# Patient Record
Sex: Female | Born: 2000 | Race: Black or African American | Hispanic: No | Marital: Single | Smoking: Never smoker
Health system: Southern US, Community
[De-identification: ages and names within clinical notes are randomized; demographics above are authoritative.]

## PROBLEM LIST (undated history)

## (undated) DIAGNOSIS — L709 Acne, unspecified: Secondary | ICD-10-CM

## (undated) DIAGNOSIS — F938 Other childhood emotional disorders: Secondary | ICD-10-CM

---
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
## 2019-07-24 ENCOUNTER — Ambulatory Visit: Payer: No Typology Code available for payment source | Admitting: Women's Health

## 2022-09-29 ENCOUNTER — Emergency Department
Admission: EM | Admit: 2022-09-29 | Discharge: 2022-09-29 | Disposition: A | Discharge status: Home / Self Care | Payer: No Typology Code available for payment source | Attending: Emergency Medicine | Admitting: Emergency Medicine

## 2022-09-29 ENCOUNTER — Other Ambulatory Visit: Payer: Self-pay

## 2022-09-29 DIAGNOSIS — Y900 Blood alcohol level of less than 20 mg/100 ml: Secondary | ICD-10-CM | POA: Insufficient documentation

## 2022-09-29 DIAGNOSIS — F1090 Alcohol use, unspecified, uncomplicated: Secondary | ICD-10-CM

## 2022-09-29 DIAGNOSIS — Z789 Other specified health status: Secondary | ICD-10-CM

## 2022-09-29 DIAGNOSIS — F1010 Alcohol abuse, uncomplicated: Secondary | ICD-10-CM | POA: Insufficient documentation

## 2022-09-29 DIAGNOSIS — F10231 Alcohol dependence with withdrawal delirium: Secondary | ICD-10-CM | POA: Insufficient documentation

## 2022-09-29 LAB — CBC WITH DIFFERENTIAL/PLATELET
Abs Immature Granulocytes: 0.02 10*3/uL (ref 0.00–0.07)
Basophils Absolute: 0 10*3/uL (ref 0.0–0.1)
Basophils Relative: 1 %
Eosinophils Absolute: 0 10*3/uL (ref 0.0–0.5)
Eosinophils Relative: 1 %
HCT: 38.2 % (ref 36.0–46.0)
Hemoglobin: 14 g/dL (ref 12.0–15.0)
Immature Granulocytes: 0 %
Lymphocytes Relative: 40 %
Lymphs Abs: 1.9 10*3/uL (ref 0.7–4.0)
MCH: 28 pg (ref 26.0–34.0)
MCHC: 36.6 g/dL — ABNORMAL HIGH (ref 30.0–36.0)
MCV: 76.4 fL — ABNORMAL LOW (ref 80.0–100.0)
Monocytes Absolute: 0.3 10*3/uL (ref 0.1–1.0)
Monocytes Relative: 5 %
Neutro Abs: 2.5 10*3/uL (ref 1.7–7.7)
Neutrophils Relative %: 53 %
Platelets: 284 10*3/uL (ref 150–400)
RBC: 5 MIL/uL (ref 3.87–5.11)
RDW: 14.7 % (ref 11.5–15.5)
WBC: 4.7 10*3/uL (ref 4.0–10.5)
nRBC: 0 % (ref 0.0–0.2)

## 2022-09-29 LAB — COMPREHENSIVE METABOLIC PANEL
ALT: 13 U/L (ref 0–44)
AST: 17 U/L (ref 15–41)
Albumin: 4.2 g/dL (ref 3.5–5.0)
Alkaline Phosphatase: 43 U/L (ref 38–126)
Anion gap: 8 (ref 5–15)
BUN: 9 mg/dL (ref 6–20)
CO2: 25 mmol/L (ref 22–32)
Calcium: 8.9 mg/dL (ref 8.9–10.3)
Chloride: 103 mmol/L (ref 98–111)
Creatinine, Ser: 0.8 mg/dL (ref 0.44–1.00)
GFR, Estimated: >60 mL/min (ref >60)
Glucose, Bld: 95 mg/dL (ref 70–99)
Potassium: 3.4 mmol/L — ABNORMAL LOW (ref 3.5–5.1)
Sodium: 136 mmol/L (ref 135–145)
Total Bilirubin: 1.6 mg/dL — ABNORMAL HIGH (ref 0.3–1.2)
Total Protein: 7.9 g/dL (ref 6.5–8.1)

## 2022-09-29 LAB — URINALYSIS, COMPLETE (UACMP) WITH MICROSCOPIC
Bilirubin Urine: NEGATIVE
Glucose, UA: NEGATIVE mg/dL
Hgb urine dipstick: NEGATIVE
Ketones, ur: 20 mg/dL — AB
Leukocytes,Ua: NEGATIVE
Nitrite: NEGATIVE
Protein, ur: NEGATIVE mg/dL
Specific Gravity, Urine: 1.02 (ref 1.005–1.030)
pH: 6 (ref 5.0–8.0)

## 2022-09-29 LAB — URINE DRUG SCREEN, QUALITATIVE (ARMC ONLY)
Amphetamines, Ur Screen: NOT DETECTED
Barbiturates, Ur Screen: NOT DETECTED
Benzodiazepine, Ur Scrn: NOT DETECTED
Cannabinoid 50 Ng, Ur ~~LOC~~: NOT DETECTED
Cocaine Metabolite,Ur ~~LOC~~: NOT DETECTED
MDMA (Ecstasy)Ur Screen: NOT DETECTED
Methadone Scn, Ur: NOT DETECTED
Opiate, Ur Screen: NOT DETECTED
Phencyclidine (PCP) Ur S: NOT DETECTED
Tricyclic, Ur Screen: NOT DETECTED

## 2022-09-29 LAB — ETHANOL: Alcohol, Ethyl (B): <10 mg/dL (ref <10)

## 2022-09-29 LAB — PREGNANCY, URINE: Preg Test, Ur: NEGATIVE

## 2022-09-29 MED ORDER — FOLIC ACID 1 MG PO TABS
1.0000 mg | ORAL_TABLET | Freq: Every day | ORAL | Status: DC
  Administered 2022-09-29: 1 mg via ORAL
  Filled 2022-09-29: qty 1

## 2022-09-29 MED ORDER — THIAMINE MONONITRATE 100 MG PO TABS
100.0000 mg | ORAL_TABLET | Freq: Every day | ORAL | Status: DC
  Administered 2022-09-29: 100 mg via ORAL
  Filled 2022-09-29: qty 1

## 2022-09-29 MED ORDER — THIAMINE HCL 100 MG/ML IJ SOLN: 100.0000 mg | Freq: Every day | INTRAMUSCULAR | Status: DC

## 2022-09-29 MED ORDER — ADULT MULTIVITAMIN W/MINERALS CH
1.0000 | ORAL_TABLET | Freq: Every day | ORAL | Status: DC
  Administered 2022-09-29: 1 via ORAL
  Filled 2022-09-29: qty 1

## 2022-09-29 NOTE — Consult Note (Signed)
[facility] Face-to-Face Psychiatry Consult   Reason for Consult: possible alcohol abuse/ possible depression Referring Physician:  Quale Patient Identification: Vanessa Nash MRN:  [id number] Principal Diagnosis: Alcohol use Diagnosis:  Principal Problem:   Alcohol use   Total Time spent with patient: 45 minutes  Subjective:"I was worried that I might have alcohol poisoning."   Vanessa Nash is a 22 y.o. female patient admitted with alcohol use.  HPI:  Patient presented to the ED voluntarily because she thought she had "alcohol Poisoning." On evaluation, she is mildly anxious, saying she is a little embarrassed because she is here. She said her brother talked to her about her drinking and it "scared me a little." Patient adamantly denies any thought or intent of self-harm or suicide. Denies homicidal thoughts, auditory or visual hallucinations.  She is dressed neatly, speaks in clear, coherent sentences. Perceptions appear to be normal. Patient reports that she is not depressed at all. She reports living with her boyfriend and his mother; they all get along and are happy. 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 Chart review indicates she had 1 inpatient psychiatric hospitalization in 2022 and 1 in 2017 for depression, SI. Patient reports she did not follow up after hospitalizations because she did not like the way medications made her feel. She denies suicide attempts. Admits to "cutting"  when she was younger as a stress relief.     Collateral from boyfriend, Theone Murdoch  [phone]) and brother: Both agree that patient is using alcohol a little more lately. They deny having any concerns about her safety. She has not been depressed at all, no unsafe behavior. She does not have  access to weapons. They are appreciative of getting information for alcohol use programs and will encourage her to that end.    Patient will likely benefit from outpatient mental health services and alcohol use services, both at [facility] and other programs. Provided patient with their contact information.  Discussed this at length with patient. She took information and states she will contemplate. Patient does not meet criteria for involuntary commitment and declines voluntary admission for treatment.   Past Psychiatric History: Hospitalized at age 26 for depression. Hospitalized in 2022 for SI, Depression.   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 Family History: No family history on file. Family Psychiatric  History:  Social History:  Social History   Substance and Sexual Activity  Alcohol Use Never     Social History   Substance and Sexual Activity  Drug Use Never    Social History   Socioeconomic History   Marital status: Single    Spouse name: Not on file   Number of children: Not on file   Years of education: Not on file   Highest education level: Not on file  Occupational History   Not on file  Tobacco Use   Smoking status: Never   Smokeless tobacco: Never  Substance and Sexual Activity   Alcohol use: Never   Drug  use: Never   Sexual activity: Never  Other Topics Concern   Not on file  Social History Narrative   Not on file   Social Determinants of Health   Financial Resource Strain: Not on file  Food Insecurity: Not on file  Transportation Needs: Not on file  Physical Activity: Not on file  Stress: Not on file  Social Connections: Not on file   Additional Social History:    Allergies:  No Known Allergies  Labs:  Results for orders placed or  performed during the hospital encounter of 09/29/22 (from the past 48 hour(s))  CBC with Differential     Status: Abnormal   Collection Time: 09/29/22 10:43 AM  Result Value Ref Range   WBC 4.7 4.0 - 10.5 K/uL   RBC 5.00 3.87 - 5.11 MIL/uL   Hemoglobin 14.0 12.0 - 15.0 g/dL   HCT 40.9 81.1 - 91.4 %   MCV 76.4 (L) 80.0 - 100.0 fL   MCH 28.0 26.0 - 34.0 pg   MCHC 36.6 (H) 30.0 - 36.0 g/dL   RDW 78.2 95.6 - 21.3 %   Platelets 284 150 - 400 K/uL   nRBC 0.0 0.0 - 0.2 %   Neutrophils Relative % 53 %   Neutro Abs 2.5 1.7 - 7.7 K/uL   Lymphocytes Relative 40 %   Lymphs Abs 1.9 0.7 - 4.0 K/uL   Monocytes Relative 5 %   Monocytes Absolute 0.3 0.1 - 1.0 K/uL   Eosinophils Relative 1 %   Eosinophils Absolute 0.0 0.0 - 0.5 K/uL   Basophils Relative 1 %   Basophils Absolute 0.0 0.0 - 0.1 K/uL   Immature Granulocytes 0 %   Abs Immature Granulocytes 0.02 0.00 - 0.07 K/uL    Comment: Performed at [facility], [address] [place], [place] [address]  Comprehensive metabolic panel     Status: Abnormal   Collection Time: 09/29/22 10:43 AM  Result Value Ref Range   Sodium 136 135 - 145 mmol/L   Potassium 3.4 (L) 3.5 - 5.1 mmol/L   Chloride 103 98 - 111 mmol/L   CO2 25 22 - 32 mmol/L   Glucose, Bld 95 70 - 99 mg/dL    Comment: Glucose reference range applies only to samples taken after fasting for at least 8 hours.   BUN 9 6 - 20 mg/dL   Creatinine, Ser 8.46 0.44 - 1.00 mg/dL   Calcium 8.9 8.9 - 96.2 mg/dL   Total Protein 7.9 6.5 - 8.1 g/dL   Albumin 4.2 3.5 - 5.0 g/dL   AST 17 15 - 41 U/L   ALT 13 0 - 44 U/L   Alkaline Phosphatase 43 38 - 126 U/L   Total Bilirubin 1.6 (H) 0.3 - 1.2 mg/dL   GFR, Estimated >95 >28 mL/min    Comment: (NOTE) Calculated using the CKD-EPI Creatinine Equation (2021)    Anion gap 8 5 - 15    Comment: Performed at [facility], [address] [place], [place] [address]  Ethanol     Status: None   Collection Time: 09/29/22 10:43 AM   Result Value Ref Range   Alcohol, Ethyl (B) <10 <10 mg/dL    Comment: (NOTE) Lowest detectable limit for serum alcohol is 10 mg/dL.  For medical purposes only. Performed at [facility], [address] [place], [place] [address]   Pregnancy, urine     Status: None   Collection Time: 09/29/22 11:17 AM  Result Value Ref Range  Preg Test, Ur NEGATIVE NEGATIVE    Comment: Performed at [facility], [address] [place] Screen, Qualitative [facility] only)     Status: None   Collection Time: 09/29/22 11:17 AM  Result Value Ref Range   Tricyclic, Ur Screen NONE DETECTED NONE DETECTED   Amphetamines, Ur Screen NONE DETECTED NONE DETECTED   MDMA (Ecstasy)Ur Screen NONE DETECTED NONE DETECTED   Cocaine Metabolite,Ur [place] NONE DETECTED NONE DETECTED   Opiate, Ur Screen NONE DETECTED NONE DETECTED   Phencyclidine (PCP) Ur S NONE DETECTED NONE DETECTED   Cannabinoid 50 Ng, Ur [place] NONE DETECTED NONE DETECTED   Barbiturates, Ur Screen NONE DETECTED NONE DETECTED   Benzodiazepine, Ur Scrn NONE DETECTED NONE DETECTED   Methadone Scn, Ur NONE DETECTED NONE DETECTED    Comment: (NOTE) Tricyclics + metabolites, urine    Cutoff 1000 ng/mL Amphetamines + metabolites, urine  Cutoff 1000 ng/mL MDMA (Ecstasy), urine              Cutoff 500 ng/mL Cocaine Metabolite, urine          Cutoff 300 ng/mL Opiate + metabolites, urine        Cutoff 300 ng/mL Phencyclidine (PCP), urine         Cutoff 25 ng/mL Cannabinoid, urine                 Cutoff 50 ng/mL Barbiturates + metabolites, urine  Cutoff 200 ng/mL Benzodiazepine, urine              Cutoff 200 ng/mL Methadone, urine                   Cutoff 300 ng/mL  The urine drug screen provides only a preliminary, unconfirmed analytical test result and should not be used for non-medical purposes. Clinical consideration and professional judgment should be applied to any positive drug screen result due to  possible interfering substances. A more specific alternate chemical method must be used in order to obtain a confirmed analytical result. Gas chromatography / mass spectrometry (GC/MS) is the preferred confirm atory method. Performed at [facility], [address] [place], [place] [address]   Urinalysis, Complete w Microscopic -Urine, Clean Catch     Status: Abnormal   Collection Time: 09/29/22 11:17 AM  Result Value Ref Range   Color, Urine YELLOW (A) YELLOW   APPearance HAZY (A) CLEAR   Specific Gravity, Urine 1.020 1.005 - 1.030   pH 6.0 5.0 - 8.0   Glucose, UA NEGATIVE NEGATIVE mg/dL   Hgb urine dipstick NEGATIVE NEGATIVE   Bilirubin Urine NEGATIVE NEGATIVE   Ketones, ur 20 (A) NEGATIVE mg/dL   Protein, ur NEGATIVE NEGATIVE mg/dL   Nitrite NEGATIVE NEGATIVE   Leukocytes,Ua NEGATIVE NEGATIVE   RBC / HPF 0-5 0 - 5 RBC/hpf   WBC, UA 0-5 0 - 5 WBC/hpf   Bacteria, UA RARE (A) NONE SEEN   Squamous Epithelial / HPF 6-10 0 - 5 /HPF   Mucus PRESENT     Comment: Performed at [facility], [address] [place], [place] [address]    Current Facility-Administered Medications  Medication Dose Route Frequency Provider Last Rate Last Admin   folic acid (FOLVITE) tablet 1 mg  1 mg Oral Daily Sharyn Creamer, MD   1 mg at 09/29/22 1208   multivitamin with minerals tablet 1 tablet  1 tablet Oral Daily Sharyn Creamer, MD   1 tablet at 09/29/22 1208  thiamine (VITAMIN B1) tablet 100 mg  100 mg Oral Daily Sharyn Creamer, MD   100 mg at 09/29/22 1208   Or   thiamine (VITAMIN B1) injection 100 mg  100 mg Intravenous Daily Sharyn Creamer, MD       Current Outpatient Medications  Medication Sig Dispense Refill   doxycycline (VIBRA-TABS) 100 MG tablet Take 1 tablet (100 mg total) by mouth 2 (two) times daily. (Patient not taking: Reported on 07/08/2018) 60 tablet 0   hydrOXYzine (ATARAX/VISTARIL) 25 MG tablet Take 1 tablet (25 mg total) by mouth at bedtime. (Patient not taking: Reported  on 07/08/2018) 30 tablet 0   sertraline (ZOLOFT) 25 MG tablet Take 1 tablet (25 mg total) by mouth daily. (Patient not taking: Reported on 07/08/2018) 30 tablet 0    Musculoskeletal: Strength & Muscle Tone: within normal limits Gait & Station: normal Patient leans: N/A   Psychiatric Specialty Exam:  Presentation  General Appearance: Appropriate for Environment  Eye Contact:Good  Speech:Clear and Coherent  Speech Volume:Normal  Handedness:No data recorded  Mood and Affect  Mood:Euthymic  Affect:Congruent   Thought Process  Thought Processes:Coherent  Descriptions of Associations:Intact  Orientation:Full (Time, Place and Person)  Thought Content:WDL  History of Schizophrenia/Schizoaffective disorder:No data recorded Duration of Psychotic Symptoms:No data recorded Hallucinations:Hallucinations: None  Ideas of Reference:No data recorded Suicidal Thoughts:Suicidal Thoughts: No  Homicidal Thoughts:Homicidal Thoughts: No   Sensorium  Memory:Immediate Good; Recent Good  Judgment:Fair  Insight:Good   Executive Functions  Concentration:Fair  Attention Span:Good  Recall:Good  Fund of Knowledge:Good  Language:Good   Psychomotor Activity  Psychomotor Activity:Psychomotor Activity: Normal   Assets  Assets:Communication Skills; Desire for Improvement; Housing; Social Support; Resilience; Physical Health   Sleep  Sleep:Sleep: Good   Physical Exam: Physical Exam Vitals and nursing note reviewed.  HENT:     Head: Normocephalic.     Nose: No congestion or rhinorrhea.  Eyes:     General:        Right eye: No discharge.        Left eye: No discharge.  Cardiovascular:     Rate and Rhythm: Normal rate.  Pulmonary:     Effort: Pulmonary effort is normal.  Musculoskeletal:        General: Normal range of motion.     Cervical back: Normal range of motion.  Skin:    General: Skin is dry.  Neurological:     Mental Status: She is alert and oriented  to person, place, and time.  Psychiatric:        Attention and Perception: Attention normal.        Mood and Affect: Mood normal.        Behavior: Behavior normal.        Thought Content: Thought content normal.        Cognition and Memory: Cognition normal.        Judgment: Judgment normal.    Review of Systems  Constitutional: Negative.   HENT: Negative.    Eyes: Negative.   Respiratory: Negative.    Cardiovascular: Negative.   Genitourinary: Negative.   Musculoskeletal: Negative.   Skin: Negative.   Psychiatric/Behavioral:  Positive for substance abuse. Negative for depression, hallucinations, memory loss and suicidal ideas. The patient is nervous/anxious. The patient does not have insomnia.    Blood pressure 120/80, pulse 87, temperature 98.7 F (37.1 C), temperature source Oral, resp. rate 17, height 5\' 7"  (1.702 m), weight 60.3 kg, SpO2 100 %. Body mass index is 20.83 kg/m.  Treatment Plan Summary: Plan Outpatient services for alcohol abuse and mental health services given. Reviewed with Dr. Fanny Bien  Disposition: No evidence of imminent risk to self or others at present.   Supportive therapy provided about ongoing stressors. Discussed crisis plan, support from social network, calling 911, coming to the Emergency Department, and calling Suicide Hotline.  Vanetta Mulders, NP 09/29/2022 5:49 PM

## 2022-09-29 NOTE — ED Notes (Signed)
Pt reports drinking unknown amount of alcohol daily. Pt unsure how much she drinks daily but reports she drank "2 four locos & a couple shots of tequila". Pt denies SI/HI, AH/VH

## 2022-09-29 NOTE — ED Provider Triage Note (Signed)
Emergency Medicine Provider Triage Evaluation Note  Vanessa Nash , a 22 y.o. female  was evaluated in triage.  Pt complains of "alcohol poisoning" or "too much alcohol".  Patient reports 2 days ago she drank unknown amount of beer followed by greater than 5 shots of tequila.    Review of Systems  Positive: Tremors, anxious Negative: No nausea, vomiting or diarrhea  Physical Exam  BP 123/89   Pulse 89   Temp 98.7 F (37.1 C) (Oral)   Resp 18   Ht  (1.702 m)   Wt 60.3 kg   LMP  (LMP Unknown)   SpO2 100%   BMI 20.83 kg/m  Gen:   Awake, no distress   Resp:  Normal effort  MSK:   Moves extremities without difficulty.  Unable to keep right lower extremity still during triage. Other:    Medical Decision Making  Medically screening exam initiated at 10:41 AM.  Appropriate orders placed.  Earle A Hagood was informed that the remainder of the evaluation will be completed by another provider, this initial triage assessment does not replace that evaluation, and the importance of remaining in the ED until their evaluation is complete.     Tommi Rumps, PA-C 09/29/22 1131

## 2022-09-29 NOTE — ED Provider Notes (Signed)
[facility] Provider Note    Event Date/Time   First MD Initiated Contact with Patient 09/29/22 1129     (approximate)   History   Delirium Tremens (DTS)   HPI  Vanessa Nash is a 22 y.o. female history of alcohol abuse   Patient reports that she came today because she needs treatment for alcohol use.  She drinks frequently, but sometimes will go a few days without consuming alcohol.  She finally decided about 2 days ago to stop drinking.  She reports that since then she has not slept very well and she has had at times some scary dreams, but reports they occur while she is resting and sleeping and not while she is awake.  Denies any hallucinations denies desire to harm herself or anyone else.  She is very interested in obtaining treatment options for alcohol use  Denies any tremor no nausea or vomiting no headaches no confusion.  She has no history of severe withdrawals or seizures in the past  She quantifies that sometimes she will drink about a third of a bottle, not a very large bottle she does not know the size of it.  But oftentimes will not drink every day either     Physical Exam   Triage Vital Signs: ED Triage Vitals  Enc Vitals Group     BP 09/29/22 1036 123/89     Pulse Rate 09/29/22 1036 89     Resp 09/29/22 1036 18     Temp 09/29/22 1036 98.7 F (37.1 C)     Temp Source 09/29/22 1036 Oral     SpO2 09/29/22 1036 100 %     Weight 09/29/22 1037 133 lb (60.3 kg)     Height 09/29/22 1037  (1.702 m)     Head Circumference --      Peak Flow --      Pain Score --      Pain Loc --      Pain Edu? --      Excl. in GC? --     Most recent vital signs: Vitals:   09/29/22 1036 09/29/22 1137  BP: 123/89 123/89  Pulse: 89 89  Resp: 18   Temp: 98.7 F (37.1 C)   SpO2: 100%      General: Awake, no distress.  No sweating. CV:  Good peripheral perfusion.  Resp:  Normal effort.  Abd:  No distention.  Other:  No tremors.  Fully  alert well-oriented.  Gives normal history.  No tangential thinking.  Does not appear to be having any hallucinations.  Denies any desire to harm herself or others.   ED Results / Procedures / Treatments   Labs (all labs ordered are listed, but only abnormal results are displayed) Labs Reviewed  CBC WITH DIFFERENTIAL/PLATELET - Abnormal; Notable for the following components:      Result Value   MCV 76.4 (*)    MCHC 36.6 (*)    All other components within normal limits  COMPREHENSIVE METABOLIC PANEL - Abnormal; Notable for the following components:   Potassium 3.4 (*)    Total Bilirubin 1.6 (*)    All other components within normal limits  URINALYSIS, COMPLETE (UACMP) WITH MICROSCOPIC - Abnormal; Notable for the following components:   Color, Urine YELLOW (*)    APPearance HAZY (*)    Ketones, ur 20 (*)    Bacteria, UA RARE (*)    All other components within normal limits  ETHANOL  PREGNANCY, URINE  URINE DRUG SCREEN, QUALITATIVE ([facility] ONLY)     EKG     RADIOLOGY     PROCEDURES:  Critical Care performed: No  Procedures   MEDICATIONS ORDERED IN ED: Medications  thiamine (VITAMIN B1) tablet 100 mg (100 mg Oral Given 09/29/22 1208)    Or  thiamine (VITAMIN B1) injection 100 mg ( Intravenous See Alternative 09/29/22 1208)  folic acid (FOLVITE) tablet 1 mg (1 mg Oral Given 09/29/22 1208)  multivitamin with minerals tablet 1 tablet (1 tablet Oral Given 09/29/22 1208)     IMPRESSION / MDM / ASSESSMENT AND PLAN / ED COURSE  I reviewed the triage vital signs and the nursing notes.                              Differential diagnosis includes, but is not limited to, alcohol abuse, alcohol withdrawal although she has no hemodynamic abnormalities no tachycardia no hypertension no sweats, no confusion or hallucinations.  She denies a history of complicated withdrawal.  At the present time she does not appear to have evidence of acute alcohol withdrawal.  I do think she may  be having some related sleep disturbance.  I consulted with our psychiatrist, they advised she does not need psychiatric admission but did recommend she follow-up with [facility] and outpatient substance abuse resources which they provided Demetrios Isaacs)  Patient's presentation is most consistent with acute, uncomplicated illness.  Patient's lab work reviewed reassuring.  Patient resting comfortably in the hallway.  No evidence of acute medical condition no evidence of acute withdrawal at this time.  Patient given and provided outpatient resources which she will engage with           FINAL CLINICAL IMPRESSION(S) / ED DIAGNOSES   Final diagnoses:  Alcohol abuse     Rx / DC Orders   ED Discharge Orders     None        Note:  This document was prepared using Dragon voice recognition software and may include unintentional dictation errors.   Sharyn Creamer, MD 09/29/22 1414

## 2022-09-29 NOTE — BH Assessment (Signed)
Comprehensive Clinical Assessment (CCA) Screening, Triage and Referral Note  09/29/2022 Vanessa Nash [id number]  Vanessa Nash, 22 year old female who presents to [facility] ED voluntarily for treatment. Per triage note, Pt reports drinking unknown amount of alcohol daily. Pt unsure how much she drinks daily but reports she drank "2 four locos & a couple shots of tequila". Pt denies SI/HI, AH/VH   During TTS assessment pt presents alert and oriented x 4, restless but cooperative, and mood-congruent with affect. The pt does not appear to be responding to internal or external stimuli. Neither is the pt presenting with any delusional thinking. Pt verified the information provided to triage RN.   Pt identifies her main complaint to be that she thought she had alcohol poisoning and came to the ED to get checked out. Patient reports 2 days ago she drank more than usual and started "feeling very dizzy and just bad." Patient reports for the past month she has been drinking 3-5 times a week. Patient states that although she is not currently depressed or in a bad state of mind, she drinks because "it's available." Patient states she lives with her boyfriend and his mom. Patient reports she recently quit her job due to having car issues. Patient denies using any other illicit substances.  Pt reports INPT hx a couple of years ago for anxiety; however, she did not follow up for outpatient tx. Patient reports she was discharged with meds but she stopped taking them because she did not like the way they made her feel. Pt denies current SI/HI/AH/VH. Pt contracts for safety. Patient is coherent and speaking in linear sentences.    Per Vanessa Ober, NP, pt does not meet criteria for inpatient psychiatric admission.    Chief Complaint:  Chief Complaint  Patient presents with   Delirium Tremens (DTS)   Visit Diagnosis:   Patient Reported Information How did you hear about [place]? Family/Friend  What Is the Reason for Your  Visit/Call Today? Patient reports she thought she had alcohol poisoning.  How Long Has This Been Causing You Problems? <Week  What Do You Feel Would Help You the Most Today? -- (Assessment only)   Have You Recently Had Any Thoughts About Hurting Yourself? No  Are You Planning to Commit Suicide/Harm Yourself At This time? No   Have you Recently Had Thoughts About Hurting Someone Vanessa Nash? No  Are You Planning to Harm Someone at This Time? No  Explanation: No data recorded  Have You Used Any Alcohol or Drugs in the Past 24 Hours? No  How Long Ago Did You Use Drugs or Alcohol? No data recorded What Did You Use and How Much? Alcohol- unknown amount   Do You Currently Have a Therapist/Psychiatrist? No  Name of Therapist/Psychiatrist: No data recorded  Have You Been Recently Discharged From Any Office Practice or Programs? No data recorded Explanation of Discharge From Practice/Program: No data recorded   CCA Screening Triage Referral Assessment Type of Contact: Face-to-Face  Telemedicine Service Delivery:   Is this Initial or Reassessment?   Date Telepsych consult ordered in [facility]:    Time Telepsych consult ordered in [facility]:    Location of Assessment: [facility] ED  Provider Location: [facility] ED    Collateral Involvement: Boyfriend- Vanessa Nash   Does Patient Have a Automotive engineer Guardian? No data recorded Name and Contact of Legal Guardian: No data recorded If Minor and Not Living with Parent(s), Who has Custody? No data recorded Is CPS involved or ever been involved?  No data recorded Is APS involved or ever been involved? No data recorded  Patient Determined To Be At Risk for Harm To Self or Others Based on Review of Patient Reported Information or Presenting Complaint? No  Method: No Plan  Availability of Means: No access or NA  Intent: Vague intent or NA  Notification Required: No data recorded Additional Information for Danger to Others Potential: No data  recorded Additional Comments for Danger to Others Potential: No data recorded Are There Guns or Other Weapons in Your Home? No data recorded Types of Guns/Weapons: No data recorded Are These Weapons Safely Secured?                            No data recorded Who Could Verify You Are Able To Have These Secured: No data recorded Do You Have any Outstanding Charges, Pending Court Dates, Parole/Probation? No data recorded Contacted To Inform of Risk of Harm To Self or Others: No data recorded  Does Patient Present under Involuntary Commitment? No    [place] of Residence:    Patient Currently Receiving the Following Services: Not Receiving Services   Determination of Need: Emergent (2 hours)   Options For Referral: Chemical Dependency Intensive Outpatient Therapy (CDIOP); Outpatient Therapy; Medication Management; ED Visit   Discharge Disposition:     Vanessa Nash, Counselor, LCAS-A

## 2022-09-29 NOTE — ED Triage Notes (Signed)
Pt states consumes large quantities of etoh daily, but has not had any in two days. Pt anxious, states is not able to sleep, is irritable. Slight tremors noted to hands, denies nausea.

## 2024-01-09 ENCOUNTER — Encounter: Payer: Self-pay | Admitting: Emergency Medicine

## 2024-01-09 ENCOUNTER — Other Ambulatory Visit: Payer: Self-pay

## 2024-01-09 ENCOUNTER — Emergency Department
Admission: EM | Admit: 2024-01-09 | Discharge: 2024-01-09 | Disposition: A | Discharge status: Home / Self Care | Payer: Self-pay | Attending: Emergency Medicine | Admitting: Emergency Medicine

## 2024-01-09 DIAGNOSIS — F1010 Alcohol abuse, uncomplicated: Secondary | ICD-10-CM | POA: Diagnosis not present

## 2024-01-09 DIAGNOSIS — F10130 Alcohol abuse with withdrawal, uncomplicated: Secondary | ICD-10-CM | POA: Insufficient documentation

## 2024-01-09 DIAGNOSIS — F10930 Alcohol use, unspecified with withdrawal, uncomplicated: Secondary | ICD-10-CM

## 2024-01-09 DIAGNOSIS — F419 Anxiety disorder, unspecified: Secondary | ICD-10-CM | POA: Insufficient documentation

## 2024-01-09 DIAGNOSIS — Y901 Blood alcohol level of 20-39 mg/100 ml: Secondary | ICD-10-CM | POA: Insufficient documentation

## 2024-01-09 LAB — COMPREHENSIVE METABOLIC PANEL WITH GFR
ALT: 26 U/L (ref 0–44)
AST: 35 U/L (ref 15–41)
Albumin: 4.3 g/dL (ref 3.5–5.0)
Alkaline Phosphatase: 43 U/L (ref 38–126)
Anion gap: 13 (ref 5–15)
BUN: 7 mg/dL (ref 6–20)
CO2: 23 mmol/L (ref 22–32)
Calcium: 9.5 mg/dL (ref 8.9–10.3)
Chloride: 102 mmol/L (ref 98–111)
Creatinine, Ser: 0.88 mg/dL (ref 0.44–1.00)
GFR, Estimated: >60 mL/min (ref >60)
Glucose, Bld: 90 mg/dL (ref 70–99)
Potassium: 4.4 mmol/L (ref 3.5–5.1)
Sodium: 138 mmol/L (ref 135–145)
Total Bilirubin: 0.6 mg/dL (ref 0.0–1.2)
Total Protein: 8.3 g/dL — ABNORMAL HIGH (ref 6.5–8.1)

## 2024-01-09 LAB — CBC
HCT: 40.7 % (ref 36.0–46.0)
Hemoglobin: 15.2 g/dL — ABNORMAL HIGH (ref 12.0–15.0)
MCH: 29.2 pg (ref 26.0–34.0)
MCHC: 37.3 g/dL — ABNORMAL HIGH (ref 30.0–36.0)
MCV: 78.1 fL — ABNORMAL LOW (ref 80.0–100.0)
Platelets: 266 K/uL (ref 150–400)
RBC: 5.21 MIL/uL — ABNORMAL HIGH (ref 3.87–5.11)
RDW: 14 % (ref 11.5–15.5)
WBC: 5.1 K/uL (ref 4.0–10.5)
nRBC: 0 % (ref 0.0–0.2)

## 2024-01-09 LAB — URINE DRUG SCREEN, QUALITATIVE (ARMC ONLY)
Amphetamines, Ur Screen: NOT DETECTED
Barbiturates, Ur Screen: NOT DETECTED
Benzodiazepine, Ur Scrn: NOT DETECTED
Cannabinoid 50 Ng, Ur ~~LOC~~: NOT DETECTED
Cocaine Metabolite,Ur ~~LOC~~: NOT DETECTED
MDMA (Ecstasy)Ur Screen: NOT DETECTED
Methadone Scn, Ur: NOT DETECTED
Opiate, Ur Screen: NOT DETECTED
Phencyclidine (PCP) Ur S: NOT DETECTED
Tricyclic, Ur Screen: NOT DETECTED

## 2024-01-09 LAB — POC URINE PREG, ED: Preg Test, Ur: NEGATIVE

## 2024-01-09 LAB — ETHANOL: Alcohol, Ethyl (B): 38 mg/dL — ABNORMAL HIGH (ref <15)

## 2024-01-09 MED ORDER — LACTATED RINGERS IV BOLUS
1000.0000 mL | Freq: Once | INTRAVENOUS | Status: AC
  Administered 2024-01-09: 1000 mL via INTRAVENOUS

## 2024-01-09 MED ORDER — PHENOBARBITAL SODIUM 130 MG/ML IJ SOLN
130.0000 mg | Freq: Once | INTRAMUSCULAR | Status: AC
  Administered 2024-01-09: 130 mg via INTRAVENOUS
  Filled 2024-01-09: qty 1

## 2024-01-09 MED ORDER — LORAZEPAM 0.5 MG PO TABS: 0.5000 mg | ORAL_TABLET | Freq: Two times a day (BID) | ORAL | 0 refills | Status: AC

## 2024-01-09 MED ORDER — LORAZEPAM 1 MG PO TABS
0.5000 mg | ORAL_TABLET | Freq: Once | ORAL | Status: AC
  Administered 2024-01-09: 0.5 mg via ORAL
  Filled 2024-01-09: qty 1

## 2024-01-09 NOTE — ED Notes (Signed)
 AAOx3.  Skin warm and dry.  Anxious, tearful at times.  Immediate plan of care discussed. Understanding verbalized.

## 2024-01-09 NOTE — ED Notes (Addendum)
 Pt completely assessed by EDP and set to discharge. No RN assessment performed.  Pt teaching provided on medications that may cause drowsiness. Pt instructed not to drive or operate heavy machinery while taking the prescribed medication. Instructed not to mix medication with ETOH. Pt verbalized understanding.   Pt provided discharge instructions and prescription information. Pt was given the opportunity to ask questions and questions were answered.

## 2024-01-09 NOTE — ED Triage Notes (Signed)
 Patient to ED via POV for alcohol detox. Pt reports drinking 2-3 40 oz beers daily (or whatever is around). States last drink was approx 1 hr ago. Denies SI/HI. Pt noted to be shaking but is able to stop when asked to hold still for blood work.

## 2024-01-09 NOTE — ED Provider Notes (Signed)
 [facility] Inpatient Provider Note    Event Date/Time   First MD Initiated Contact with Patient 01/09/24 [id number]     (approximate)   History   Alcohol Problem   HPI  Vanessa Nash is a 23 y.o. female who presents to the ED for evaluation of Alcohol Problem   I review an outside ED visit and psychiatry evaluation where patient was seen for suicidal ideations and transferred to inpatient psychiatric facility.  Patient presents to the ED requesting assistance with alcohol abuse.  Reports a desire to stop drinking alcohol but every time she tries to at home she develops the shakes and has to start drinking again.  Reports that she does not want to drink.  No other recreational drugs beyond ethanol.  Her last drink was prior to arrival this morning, only because she was having significant tremors at home.  She can recall 1 time having auditory hallucinations in the setting of alcohol withdrawals.  No syncopal episodes or seizures.  Denies any thoughts of harming herself or others this morning, no hallucinations this morning.   Physical Exam   Triage Vital Signs: ED Triage Vitals  Encounter Vitals Group     BP 01/09/24 0913 137/87     Girls Systolic BP Percentile --      Girls Diastolic BP Percentile --      Boys Systolic BP Percentile --      Boys Diastolic BP Percentile --      Pulse Rate 01/09/24 0913 (!) 106     Resp 01/09/24 0913 17     Temp 01/09/24 0913 98.8 F (37.1 C)     Temp Source 01/09/24 0913 Oral     SpO2 01/09/24 0913 96 %     Weight 01/09/24 0912 135 lb (61.2 kg)     Height 01/09/24 0912 5' 7 (1.702 m)     Head Circumference --      Peak Flow --      Pain Score 01/09/24 0912 0     Pain Loc --      Pain Education --      Exclude from Growth Chart --     Most recent vital signs: Vitals:   01/09/24 0913 01/09/24 1031  BP: 137/87 122/71  Pulse: (!) 106 98  Resp: 17 16  Temp: 98.8 F (37.1 C)   SpO2: 96% 97%    General: Awake, no  distress.  Mild tremulousness without altered mentation or delirium. CV:  Good peripheral perfusion.  Resp:  Normal effort.  Abd:  No distention.  MSK:  No deformity noted.  Neuro:  No focal deficits appreciated. Other:     ED Results / Procedures / Treatments   Labs (all labs ordered are listed, but only abnormal results are displayed) Labs Reviewed  COMPREHENSIVE METABOLIC PANEL WITH GFR - Abnormal; Notable for the following components:      Result Value   Total Protein 8.3 (*)    All other components within normal limits  ETHANOL - Abnormal; Notable for the following components:   Alcohol, Ethyl (B) 38 (*)    All other components within normal limits  CBC - Abnormal; Notable for the following components:   RBC 5.21 (*)    Hemoglobin 15.2 (*)    MCV 78.1 (*)    MCHC 37.3 (*)    All other components within normal limits  URINE DRUG SCREEN, QUALITATIVE ([facility] ONLY)  POC URINE PREG, ED    EKG  RADIOLOGY   Official radiology report(s): No results found.  PROCEDURES and INTERVENTIONS:  .Critical Care  Performed by: Claudene Rover, MD Authorized by: Claudene Rover, MD   Critical care provider statement:    Critical care time (minutes):  30   Critical care time was exclusive of:  Separately billable procedures and treating other patients   Critical care was necessary to treat or prevent imminent or life-threatening deterioration of the following conditions:  Toxidrome   Critical care was time spent personally by me on the following activities:  Development of treatment plan with patient or surrogate, discussions with consultants, evaluation of patient's response to treatment, examination of patient, ordering and review of laboratory studies, ordering and review of radiographic studies, ordering and performing treatments and interventions, pulse oximetry, re-evaluation of patient's condition and review of old charts   Medications  lactated ringers  bolus 1,000 mL (0 mLs  Intravenous Stopped 01/09/24 1027)  PHENObarbital  (LUMINAL) injection 130 mg (130 mg Intravenous Given 01/09/24 0949)  PHENObarbital  (LUMINAL) injection 130 mg (130 mg Intravenous Given 01/09/24 1027)     IMPRESSION / MDM / ASSESSMENT AND PLAN / ED COURSE  I reviewed the triage vital signs and the nursing notes.  Differential diagnosis includes, but is not limited to, alcohol withdrawal, DT, seizure, SI, polysubstance abuse  {Patient presents with symptoms of an acute illness or injury that is potentially life-threatening.  Young woman presents to the ED requesting help with alcohol withdrawals.  Evidence of mild withdrawals on presentation without DTs, seizures or more severe complications.  No evidence of acute psychiatric emergency to necessitate IVC or emergent psychiatric evaluation.  No abdominal pain or emesis.  No electrolyte derangements, normal WBC.  We will load with phenobarbital , provide IV fluids and reassess.  After multiple doses of IV phenobarbital , her tremors resolved and she has no worsening or signs of more complicating features such as DT, seizure.  She is tolerating p.o. intake and suitable for outpatient management.  I considered admission for this patient.  Clinical Course as of 01/09/24 1233  Mon Jan 09, 2024  1022 Reassessed.  Boyfriend at the bedside.  She reports feeling better but still anxious.  We discussed plan of care [DS]    Clinical Course User Index [DS] Claudene Rover, MD     FINAL CLINICAL IMPRESSION(S) / ED DIAGNOSES   Final diagnoses:  Alcohol withdrawal syndrome without complication (HCC)     Rx / DC Orders   ED Discharge Orders     None        Note:  This document was prepared using Dragon voice recognition software and may include unintentional dictation errors.   Claudene Rover, MD 01/09/24 [phone]

## 2024-01-09 NOTE — ED Provider Notes (Signed)
   [facility] Provider Note    Event Date/Time   First MD Initiated Contact with Patient 01/09/24 2150     (approximate)   History   Anxiety   HPI  Vanessa Nash is a 23 y.o. female with a history of anxiety, alcohol abuse was seen earlier today for possible alcohol withdrawal, has been feeling better but complains of significant anxiety and is requesting something to help her so that she does not turn to alcohol.  Spoke with mom on the phone as well     Physical Exam   Triage Vital Signs: ED Triage Vitals  Encounter Vitals Group     BP 01/09/24 2131 133/76     Girls Systolic BP Percentile --      Girls Diastolic BP Percentile --      Boys Systolic BP Percentile --      Boys Diastolic BP Percentile --      Pulse Rate 01/09/24 2131 (!) 108     Resp 01/09/24 2131 17     Temp 01/09/24 2131 98.8 F (37.1 C)     Temp src --      SpO2 01/09/24 2131 100 %     Weight 01/09/24 2130 61.2 kg (135 lb)     Height 01/09/24 2130 1.702 m (5' 7)     Head Circumference --      Peak Flow --      Pain Score 01/09/24 2130 0     Pain Loc --      Pain Education --      Exclude from Growth Chart --     Most recent vital signs: Vitals:   01/09/24 2131 01/09/24 2233  BP: 133/76   Pulse: (!) 108 94  Resp: 17 18  Temp: 98.8 F (37.1 C)   SpO2: 100% 100%     General: Awake, no distress.  CV:  Good peripheral perfusion.  Resp:  Normal effort.  Abd:  No distention.  Other:  No tremors   ED Results / Procedures / Treatments   Labs (all labs ordered are listed, but only abnormal results are displayed) Labs Reviewed - No data to display   EKG     RADIOLOGY     PROCEDURES:  Critical Care performed:   Procedures   MEDICATIONS ORDERED IN ED: Medications  LORazepam  (ATIVAN ) tablet 0.5 mg (0.5 mg Oral Given 01/09/24 2231)     IMPRESSION / MDM / ASSESSMENT AND PLAN / ED COURSE  I reviewed the triage vital signs and the nursing  notes. Patient's presentation is most consistent with exacerbation of chronic illness.  Patient overall well-appearing and in no acute distress here, will provide brief course of Ativan  to help the patient with her anxiety, have referred her to [facility], she and mother agree with this plan        FINAL CLINICAL IMPRESSION(S) / ED DIAGNOSES   Final diagnoses:  Anxiety  Alcohol abuse     Rx / DC Orders   ED Discharge Orders          Ordered    LORazepam  (ATIVAN ) 0.5 MG tablet  2 times daily        01/09/24 2222             Note:  This document was prepared using Dragon voice recognition software and may include unintentional dictation errors.   Arlander Charleston, MD 01/09/24 [phone]

## 2024-01-09 NOTE — ED Notes (Signed)
 AAOx3.  Skin warm and dry.  C/O feeling jittery and anxious. Dr. Claudene at bedside.  Additional orders placed.  Continue to monitor.

## 2024-01-09 NOTE — ED Triage Notes (Signed)
 Pt reports she was seen here earlier for alcohol detox, pt reports she was given Phenobarbital  while she was here that helped relieve her symptoms. Pt reports since returning home she has became anxious. Pt is denying other symptoms at this time.
